# Patient Record
Sex: Female | Born: 1965 | Hispanic: Yes | Smoking: Never smoker
Health system: Southern US, Community
[De-identification: ages and names within clinical notes are randomized; demographics above are authoritative.]

---

## 2004-11-10 ENCOUNTER — Observation Stay: Payer: Self-pay | Admitting: Obstetrics and Gynecology

## 2004-11-11 ENCOUNTER — Inpatient Hospital Stay: Payer: Self-pay | Admitting: Obstetrics and Gynecology

## 2017-07-09 ENCOUNTER — Other Ambulatory Visit: Payer: Self-pay | Admitting: Family Medicine

## 2017-07-09 DIAGNOSIS — Z1239 Encounter for other screening for malignant neoplasm of breast: Secondary | ICD-10-CM

## 2017-08-24 ENCOUNTER — Other Ambulatory Visit: Payer: Self-pay

## 2017-08-24 ENCOUNTER — Encounter: Payer: Self-pay | Admitting: Emergency Medicine

## 2017-08-24 ENCOUNTER — Emergency Department
Admission: EM | Admit: 2017-08-24 | Discharge: 2017-08-25 | Disposition: A | Discharge status: Home / Self Care | Payer: Medicaid Other | Attending: Emergency Medicine | Admitting: Emergency Medicine

## 2017-08-24 DIAGNOSIS — R112 Nausea with vomiting, unspecified: Secondary | ICD-10-CM | POA: Diagnosis not present

## 2017-08-24 DIAGNOSIS — B349 Viral infection, unspecified: Secondary | ICD-10-CM | POA: Insufficient documentation

## 2017-08-24 DIAGNOSIS — R1084 Generalized abdominal pain: Secondary | ICD-10-CM | POA: Diagnosis present

## 2017-08-24 DIAGNOSIS — R109 Unspecified abdominal pain: Secondary | ICD-10-CM

## 2017-08-24 LAB — COMPREHENSIVE METABOLIC PANEL
ALBUMIN: 4.1 g/dL (ref 3.5–5.0)
ALT: 18 U/L (ref 14–54)
ANION GAP: 10 (ref 5–15)
AST: 21 U/L (ref 15–41)
Alkaline Phosphatase: 92 U/L (ref 38–126)
BUN: 13 mg/dL (ref 6–20)
CHLORIDE: 104 mmol/L (ref 101–111)
CO2: 25 mmol/L (ref 22–32)
Calcium: 9.9 mg/dL (ref 8.9–10.3)
Creatinine, Ser: 0.6 mg/dL (ref 0.44–1.00)
GFR calc Af Amer: >60 mL/min (ref >60)
GFR calc non Af Amer: >60 mL/min (ref >60)
GLUCOSE: 111 mg/dL — AB (ref 65–99)
POTASSIUM: 3.8 mmol/L (ref 3.5–5.1)
SODIUM: 139 mmol/L (ref 135–145)
TOTAL PROTEIN: 7.6 g/dL (ref 6.5–8.1)
Total Bilirubin: 0.5 mg/dL (ref 0.3–1.2)

## 2017-08-24 LAB — URINALYSIS, COMPLETE (UACMP) WITH MICROSCOPIC
BILIRUBIN URINE: NEGATIVE
GLUCOSE, UA: NEGATIVE mg/dL
KETONES UR: NEGATIVE mg/dL
LEUKOCYTES UA: NEGATIVE
Nitrite: NEGATIVE
PH: 5 (ref 5.0–8.0)
PROTEIN: NEGATIVE mg/dL
Specific Gravity, Urine: 1.019 (ref 1.005–1.030)

## 2017-08-24 LAB — CBC
HCT: 40 % (ref 35.0–47.0)
HEMOGLOBIN: 13.4 g/dL (ref 12.0–16.0)
MCH: 26.8 pg (ref 26.0–34.0)
MCHC: 33.5 g/dL (ref 32.0–36.0)
MCV: 80 fL (ref 80.0–100.0)
PLATELETS: 264 10*3/uL (ref 150–440)
RBC: 5.01 MIL/uL (ref 3.80–5.20)
RDW: 13.5 % (ref 11.5–14.5)
WBC: 7.9 10*3/uL (ref 3.6–11.0)

## 2017-08-24 LAB — LIPASE, BLOOD: Lipase: 25 U/L (ref 11–51)

## 2017-08-24 NOTE — ED Triage Notes (Addendum)
Patient states she attends [employer], went to class on Wednesday night when it was raining. States next day woke up with body aches and fever ("99.0 degrees"). Developed pain to lower abdomen ("shrinking pain"). Vomited x1 yesterday. Has been taking Advil to combat fever, took last dose one hour ago. States vomiting episode had small amount of blood in it. +Chills. Patient reports neck pain as well (side of neck), full range of motion in neck in triage. Patient reports history of arthritis.

## 2017-08-25 ENCOUNTER — Emergency Department: Payer: Medicaid Other

## 2017-08-25 LAB — TROPONIN I

## 2017-08-25 MED ORDER — ONDANSETRON 4 MG PO TBDP: 4.0000 mg | ORAL_TABLET | Freq: Three times a day (TID) | ORAL | 0 refills | Status: DC | PRN

## 2017-08-25 MED ORDER — GI COCKTAIL ~~LOC~~
30.0000 mL | Freq: Once | ORAL | Status: AC
  Administered 2017-08-25: 30 mL via ORAL
  Filled 2017-08-25: qty 30

## 2017-08-25 MED ORDER — FAMOTIDINE 40 MG PO TABS: 40.0000 mg | ORAL_TABLET | Freq: Every evening | ORAL | 0 refills | Status: AC

## 2017-08-25 NOTE — ED Provider Notes (Signed)
[facility] Emergency Department Provider Note   ____________________________________________   First MD Initiated Contact with Patient 08/24/17 2359     (approximate)  I have reviewed the triage vital signs and the nursing notes.   HISTORY  Chief Complaint Abdominal Pain    HPI Madison Joseph is a 51 y.o. female who comes into the hospital today with some body aches and chills.  She reports that she started having some mid abdominal pain and squeezing as well.  She reports that she has some stress and has not been sleeping well.  She had a temp she states that 98.5 and 98.7 on Thursday and Friday.  She has been taking ibuprofen but reports that the symptoms have continued.  She reports that she has been doing a lot of work for school and has not been resting.  She reports that on Saturday she felt very bad with some nausea and she did vomit she states a couple of times.  The patient thought she needed to eat but it made her feel nauseous.  She states that she has had some lightheadedness as well.  She rates her pain a 4-5 out of 10 in her upper abdomen.  The patient decided to come into the hospital today for evaluation.   History reviewed. No pertinent past medical history.  There are no active problems to display for this patient.   History reviewed. No pertinent surgical history.  Prior to Admission medications   Medication Sig Start Date End Date Taking? Authorizing Provider  famotidine (PEPCID) 40 MG tablet Take 1 tablet (40 mg total) by mouth every evening. 08/25/17 08/25/18  Rebecka Apley, MD  ondansetron (ZOFRAN ODT) 4 MG disintegrating tablet Take 1 tablet (4 mg total) by mouth every 8 (eight) hours as needed for nausea or vomiting. 08/25/17   Rebecka Apley, MD    Allergies Patient has no known allergies.  No family history on file.  Social History Social History   Tobacco Use  . Smoking status: Never Smoker  . Smokeless tobacco:  Never Used  Substance Use Topics  . Alcohol use: No    Frequency: Never  . Drug use: No    Review of Systems  Constitutional: No fever/chills Eyes: No visual changes. ENT: No sore throat. Cardiovascular: Denies chest pain. Respiratory: Denies shortness of breath. Gastrointestinal:  abdominal pain, nausea, vomiting.  No diarrhea.  No constipation. Genitourinary: Negative for dysuria. Musculoskeletal: myalgias Skin: Negative for rash. Neurological: Negative for headaches, focal weakness or numbness.   ____________________________________________   PHYSICAL EXAM:  VITAL SIGNS: ED Triage Vitals  Enc Vitals Group     BP 08/24/17 2035 139/69     Pulse Rate 08/24/17 2035 77     Resp 08/24/17 2035 18     Temp 08/24/17 2035 97.7 F (36.5 C)     Temp Source 08/24/17 2035 Oral     SpO2 08/24/17 2035 98 %     Weight 08/24/17 2035 160 lb (72.6 kg)     Height 08/24/17 2035 5\' 1"  (1.549 m)     Head Circumference --      Peak Flow --      Pain Score 08/24/17 2034 5     Pain Loc --      Pain Edu? --      Excl. in GC? --     Constitutional: Alert and oriented. Well appearing and in mild distress. Eyes: Conjunctivae are normal. PERRL. EOMI. Head: Atraumatic. Nose: No congestion/rhinnorhea.  Mouth/Throat: Mucous membranes are moist.  Oropharynx non-erythematous. Cardiovascular: Normal rate, regular rhythm. Grossly normal heart sounds.  Good peripheral circulation. Respiratory: Normal respiratory effort.  No retractions. Lungs CTAB. Gastrointestinal: Soft with some right upper quadrant tenderness to palpation. No distention. Positive bowel sounds Musculoskeletal: No lower extremity tenderness nor edema.   Neurologic:  Normal speech and language.  Skin:  Skin is warm, dry and intact.  Psychiatric: Mood and affect are normal.   ____________________________________________   LABS (all labs ordered are listed, but only abnormal results are displayed)  Labs Reviewed    COMPREHENSIVE METABOLIC PANEL - Abnormal; Notable for the following components:      Result Value   Glucose, Bld 111 (*)    All other components within normal limits  URINALYSIS, COMPLETE (UACMP) WITH MICROSCOPIC - Abnormal; Notable for the following components:   Color, Urine YELLOW (*)    APPearance HAZY (*)    Hgb urine dipstick SMALL (*)    Bacteria, UA RARE (*)    Squamous Epithelial / LPF 0-5 (*)    All other components within normal limits  LIPASE, BLOOD  CBC  TROPONIN I   ____________________________________________  EKG  none ____________________________________________  RADIOLOGY  [place] Abdomen Limited Ruq  Result Date: 08/25/2017 CLINICAL DATA:  Epigastric pain for 5 days. EXAM: ULTRASOUND ABDOMEN LIMITED RIGHT UPPER QUADRANT COMPARISON:  None. FINDINGS: Gallbladder: No gallstones or wall thickening visualized. No sonographic Murphy sign noted by sonographer. Common bile duct: Diameter: 2.3 mm Liver: No focal lesion identified. Within normal limits in parenchymal echogenicity. Portal vein is patent on color Doppler imaging with normal direction of blood flow towards the liver. Incidental finding of septated partially exophytic cyst of the superior pole of the right kidney measuring 4.6 x 3.4 x 4.1 cm. IMPRESSION: No evidence of acute cholecystitis. Normal appearance of the liver. Complex appearing 4.6 cm right renal cyst. Further evaluation with nonemergent abdominal CT or MRI, renal protocol may be considered. Electronically Signed   By: Ted Mcalpineobrinka  Dimitrova M.D.   On: 08/25/2017 04:39    ____________________________________________   PROCEDURES  Procedure(s) performed: None  Procedures  Critical Care performed: No  ____________________________________________   INITIAL IMPRESSION / ASSESSMENT AND PLAN / ED COURSE  As part of my medical decision making, I reviewed the following data within the electronic MEDICAL RECORD NUMBER Notes from prior ED visits and [place]  Controlled Substance Database   This is a 51 year old female who comes into the hospital today stating that she feels ill.  She has had some body aches and chills as well as some abdominal pain nausea and vomiting.  I did check some blood work on the patient and it was unremarkable.  Her CMP was negative, her lipase was negative and her CBC is negative.  My differential diagnosis did include cholecystitis, biliary colic, reflux, viral upper respiratory infection.  I sent the patient for an ultrasound looking at her gallbladder and it was unremarkable.  At this time I will give the patient a GI cocktail and she will be discharged.  I will encourage the patient to follow-up with her primary care physician for further evaluation of her symptoms.  She likely does have a viral illness.  She will be discharged home.      ____________________________________________   FINAL CLINICAL IMPRESSION(S) / ED DIAGNOSES  Final diagnoses:  Abdominal pain  Viral illness  Non-intractable vomiting with nausea, unspecified vomiting type     ED Discharge Orders  Ordered    ondansetron (ZOFRAN ODT) 4 MG disintegrating tablet  Every 8 hours PRN     08/25/17 0518    famotidine (PEPCID) 40 MG tablet  Every evening     08/25/17 0518       Note:  This document was prepared using Dragon voice recognition software and may include unintentional dictation errors.    Rebecka ApleyWebster, Aser Nylund P, MD 08/25/17 [phone]

## 2017-08-25 NOTE — ED Notes (Signed)
Lab called and spoke with Gwen to add on troponin to blood already in lab.

## 2017-08-25 NOTE — ED Notes (Signed)
Patient transported to Ultrasound 

## 2017-08-25 NOTE — Discharge Instructions (Signed)
Please follow up with your primary care physician.

## 2018-09-07 ENCOUNTER — Encounter: Payer: Medicaid Other | Attending: Family Medicine | Admitting: Dietician

## 2018-09-07 VITALS — Ht 60.0 in | Wt 158.3 lb

## 2018-09-07 DIAGNOSIS — E785 Hyperlipidemia, unspecified: Secondary | ICD-10-CM | POA: Insufficient documentation

## 2018-09-07 DIAGNOSIS — R635 Abnormal weight gain: Secondary | ICD-10-CM | POA: Insufficient documentation

## 2018-09-07 DIAGNOSIS — Z713 Dietary counseling and surveillance: Secondary | ICD-10-CM | POA: Diagnosis not present

## 2018-09-07 NOTE — Progress Notes (Signed)
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  Preferred learning method:  . Visual   Current weight: 158.3lbs with shoes  Height: 5'0" Medications, supplements: reconciled list in medical record  Progress and evaluation: Patient reorts significant weight gain in past couple of years due to decreased physical activity (limited time with full-time school + caring for children); She reports maintaining healthy weight until then.   She reports high stress level with load of responsibilities. She has noticed some increased GI reflux when eating larger portions of bread, and bloating when drinking milk. Cholesterol is elevated, with 06/2018 results of 259 total cholesterol, 178LDL, 184 Triglycerides, 44HDL.  Physical activity: walking, gardening 2-3 hours, 2 times a week  Dietary Intake:  Usual eating pattern includes 3 meals and 2-3 snacks per day. Dining out frequency: 3-4 meals per week.  Breakfast: coffee + fruit banana or apple or orange, pomegranate + toast with fig preserves 1-2x a week or occ cream cheese; or avocado Snack: yogurt Lunch: usually salad with variety of veg. + avocado, sauteed chicken with bell peppers or [place] and provolone or mozarella on sandwich whole wheat or french bread or pita + water or lemonade Snack: granola bar; almond butter crackers Supper: largest meal -- rice sometimes beans/ potatoes + fish 2-3x a week shrimp or chicken + avocado Snack: fruit -- blueberries; occ sweets -- flan maybe 2x a week Beverages: lemonade, water, coffee, tea  Nutrition Care Education: Topics covered: weight control, hyperlipidemia Basic nutrition: basic food groups, appropriate nutrient balance, appropriate meal and snack schedule, general nutrition guidelines     Weight control: benefits of weight control, food and non-food factors that can affect weight, importance of low sugar and low fat choices, appropriate food portions and intake for 1300kcal for weight loss, benefits of tracking intake. Hyperlipidemia: healthy and unhealthy fats, role of fiber, food sources of phytochemicals   Nutritional Diagnosis:  [place]-2.2 Altered nutrition-related laboratory As related to hyperlipidemia.  As evidenced by total cholesterol 259mg /dl, LDL 178mg /dl, and Triglycerides 184mg /dl. [place]-3.4 Unintentional weight gain As related to increased stress, limited activity, excess calories.  As evidenced by patient with current BMI of 30.8, and patient report of eating pattern and lifestyle.  Intervention:   Instruction as noted above.  Patient has been working on healthy eating habits for several years due to hyperlipidemia.  Set goals with direction from patient.   Education Materials given:  . Plate Planner with food lists . Sample meal pattern/ menus . 52 [employer]ucers . Goals/ instructions   Learner/ who was taught:  . Patient   Level of understanding: Marland Kitchen. Verbalizes/ demonstrates competency   Demonstrated degree of understanding via:   Teach back Learning barriers: . None  Willingness to learn/ readiness for change: . Eager, change in progress  Monitoring and Evaluation:  Dietary intake, exercise, blood lipids, and body weight      follow up: 10/19/18

## 2018-09-07 NOTE — Patient Instructions (Signed)
   Try pre-portioning snacks such as nuts to avoid overeating. Keep nuts to 1/4 cup.   Measure food portions of starches such as rice, avocado (ideally 1/2 or less at each meal)  Gradually increase physical activity, even by spending 10-15 minutes walking or other activity, or take stretch or moving breaks while studying.   Keep a journal of food intake and activity to increase awareness and pinpoint areas for improvement.

## 2018-10-19 ENCOUNTER — Ambulatory Visit: Payer: Medicaid Other | Admitting: Dietician

## 2018-10-22 ENCOUNTER — Telehealth: Payer: Self-pay | Admitting: Dietician

## 2018-10-22 NOTE — Telephone Encounter (Signed)
Called patient to reschedule missed appointment from 10/19/18. Left voicemail message requesting a call back.

## 2018-11-05 ENCOUNTER — Encounter: Payer: Self-pay | Admitting: Dietician

## 2019-07-15 IMAGING — US US ABDOMEN LIMITED
1 series · 14 of 25 positions shown · non-contrast
Comparison: None.

CLINICAL DATA: Epigastric pain for 5 days.

EXAM:
ULTRASOUND ABDOMEN LIMITED RIGHT UPPER QUADRANT

[Series 1: us abdomen limited · 0.21mm/px · 14 of 46 slices shown]
[im 1/46]
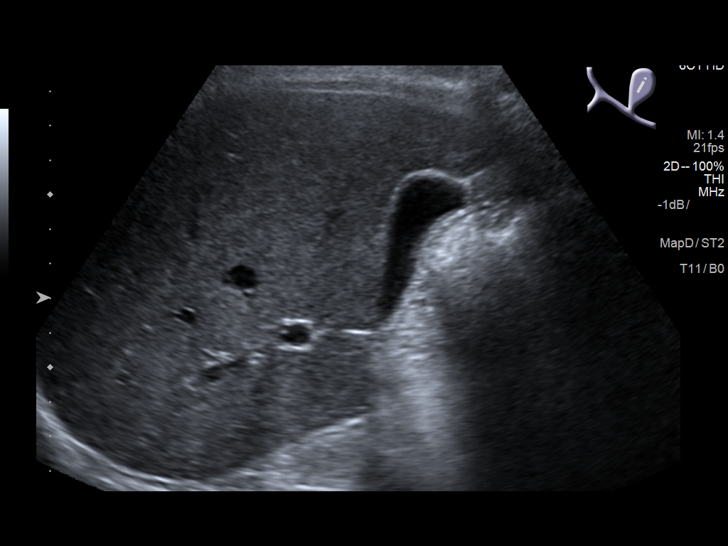
[im 4/46]
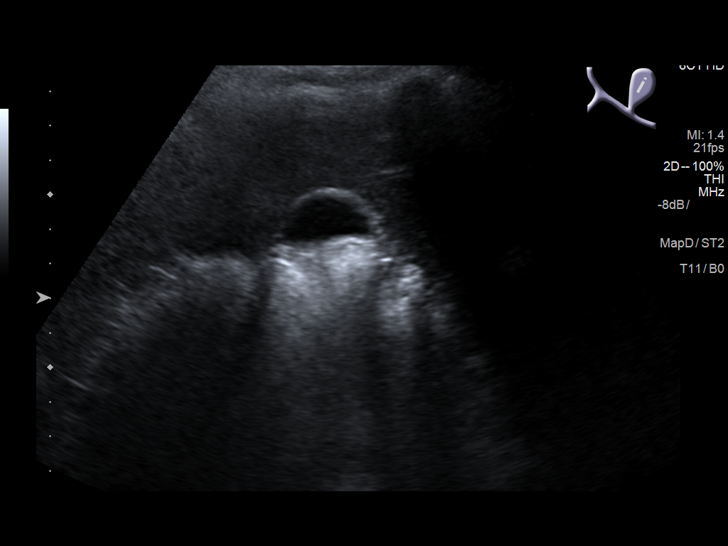
[im 8/46]
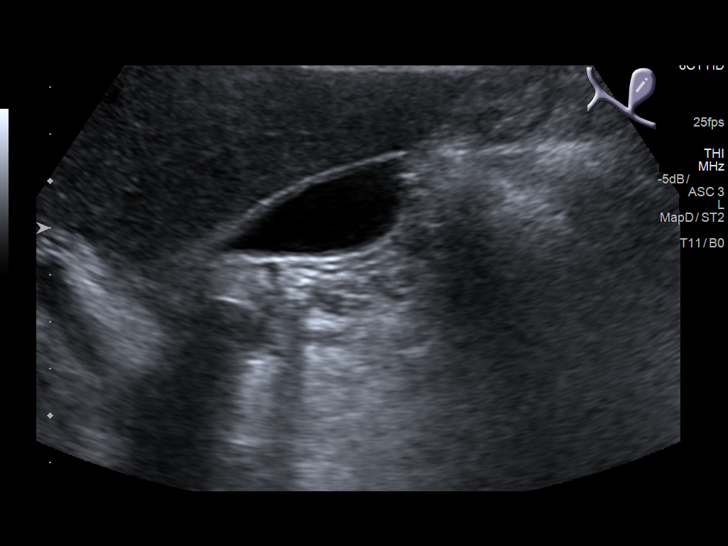
[im 12/46]
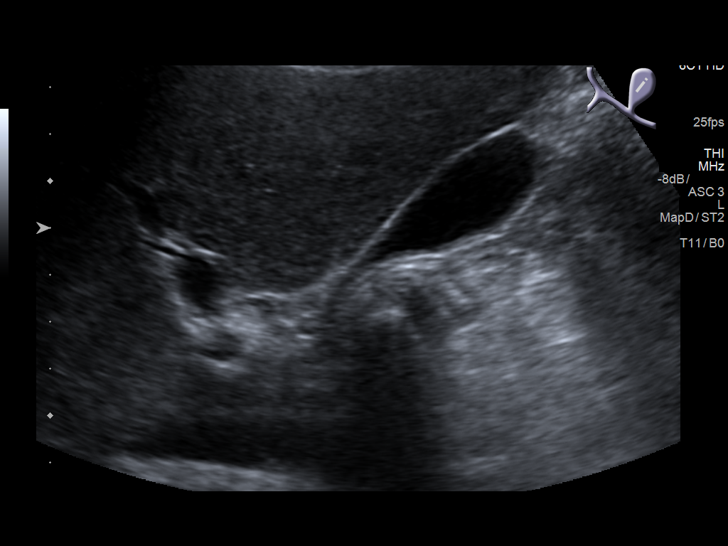
[im 16/46]
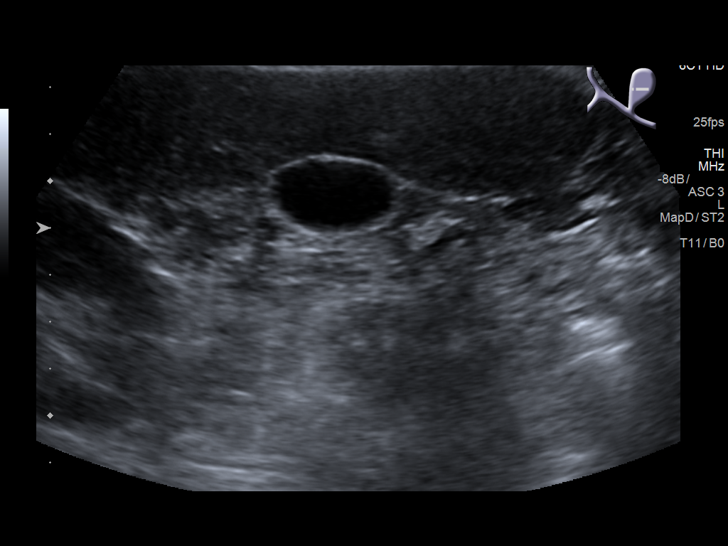
[im 17/46]
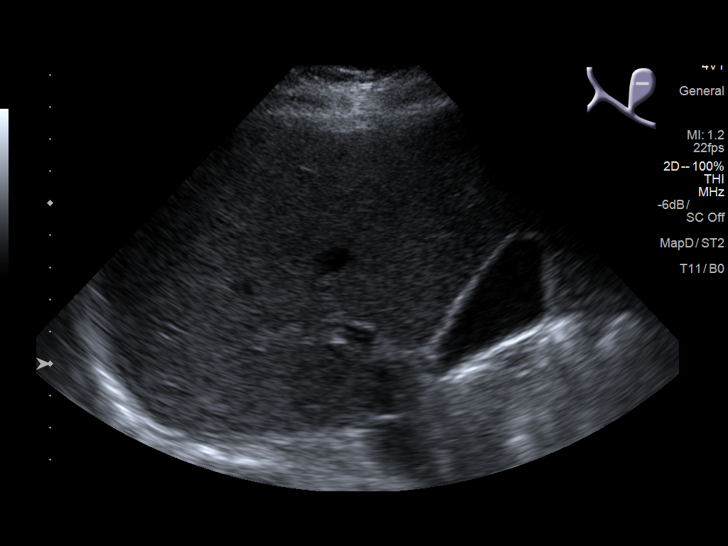
[im 21/46]
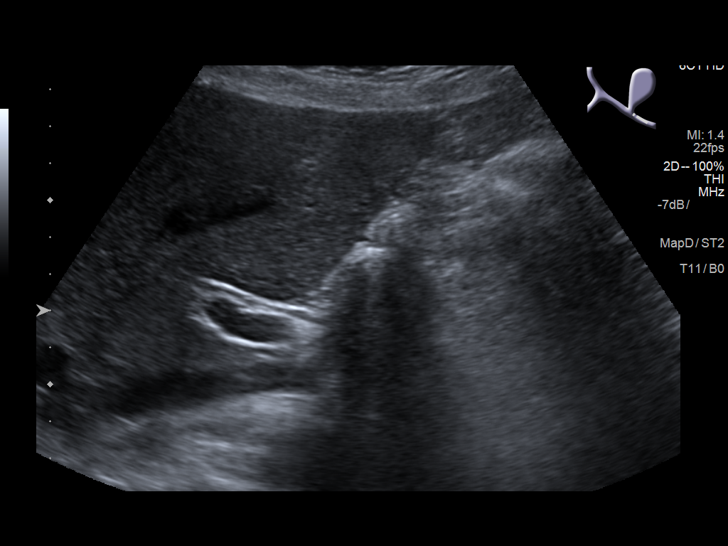
[im 25/46]
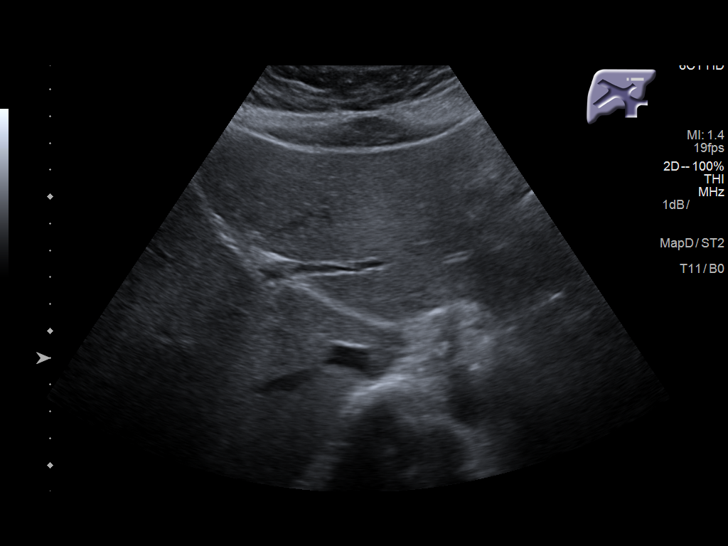
[im 29/46]
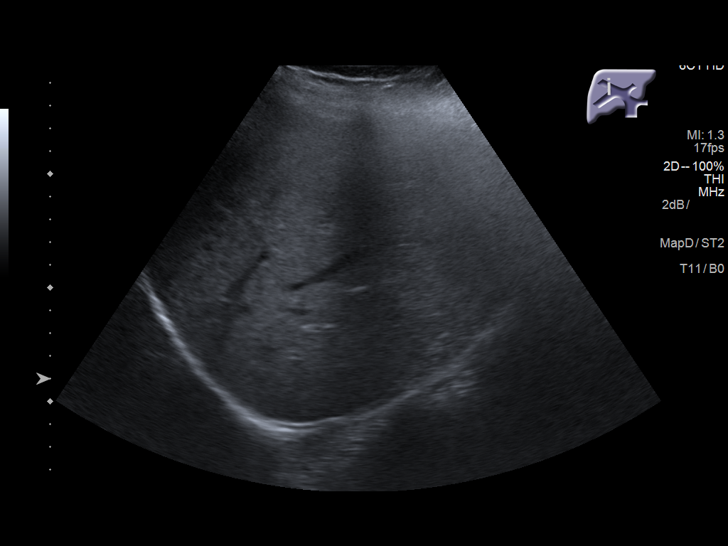
[im 31/46]
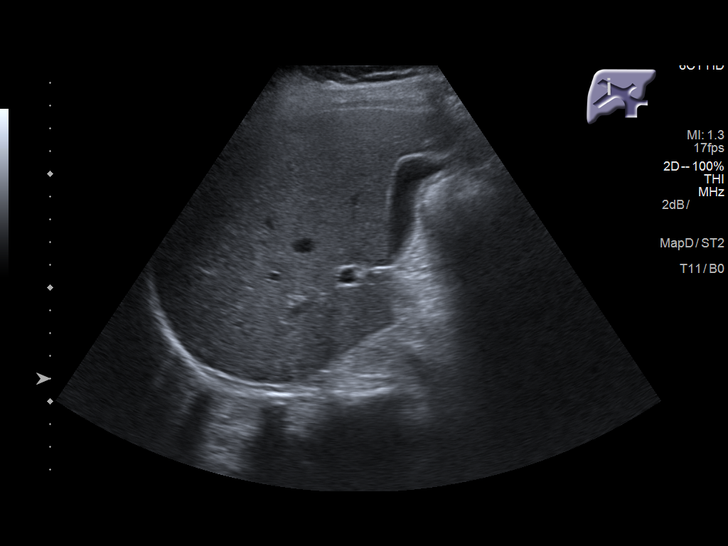
[im 34/46]
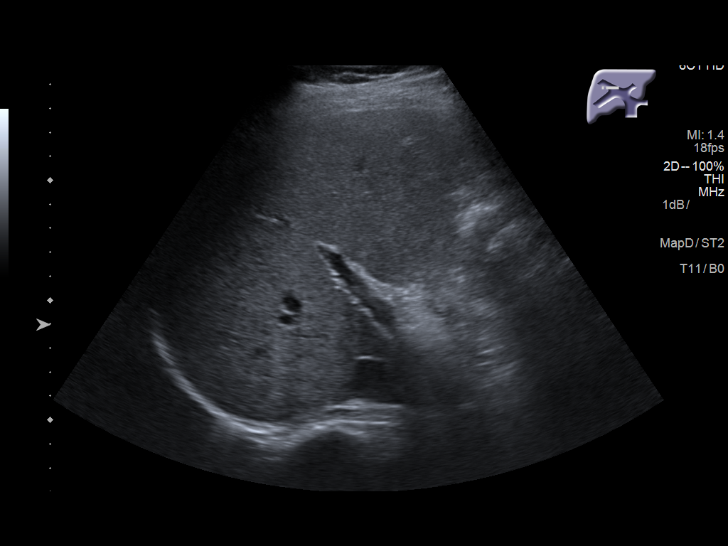
[im 38/46]
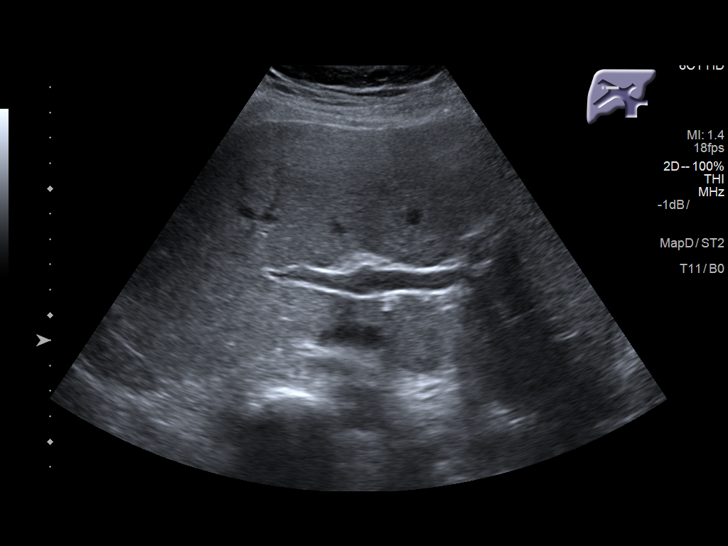
[im 42/46]
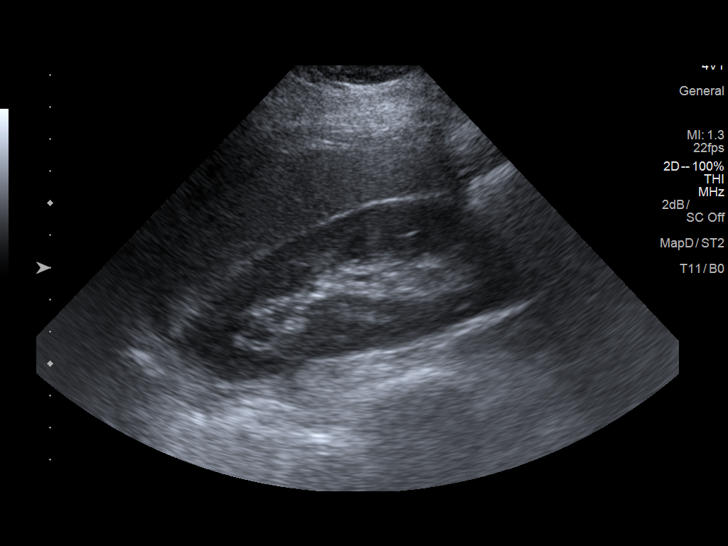
[im 46/46]
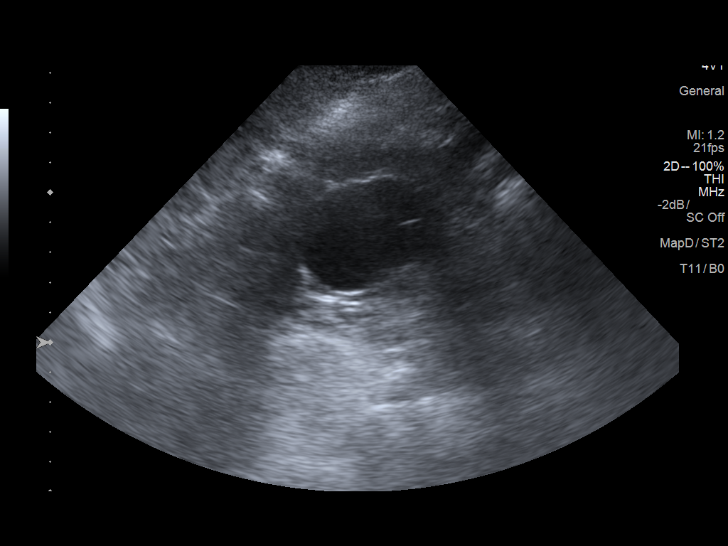

[14 of 25 positions shown; findings below may reference images not displayed]

FINDINGS: Gallbladder:

No gallstones or wall thickening visualized. No sonographic Murphy
sign noted by sonographer.

Common bile duct:

Diameter: 2.3 mm

Liver:

No focal lesion identified. Within normal limits in parenchymal
echogenicity. Portal vein is patent on color Doppler imaging with
normal direction of blood flow towards the liver.

Incidental finding of septated partially exophytic cyst of the
superior pole of the right kidney measuring 4.6 x 3.4 x 4.1 cm.
IMPRESSION: No evidence of acute cholecystitis.

Normal appearance of the liver.

Complex appearing 4.6 cm right renal cyst. Further evaluation with
nonemergent abdominal CT or MRI, renal protocol may be considered.

## 2020-02-18 ENCOUNTER — Ambulatory Visit: Payer: Medicaid Other | Attending: Internal Medicine

## 2020-02-18 ENCOUNTER — Other Ambulatory Visit: Payer: Self-pay

## 2020-02-18 DIAGNOSIS — Z23 Encounter for immunization: Secondary | ICD-10-CM

## 2020-02-18 NOTE — Progress Notes (Signed)
   Covid-19 Vaccination Clinic  Name:  Kymoni Lesperance    MRN: [id number] DOB: 1966/07/31  02/18/2020  Ms. Hoel was observed post Covid-19 immunization for 15 minutes without incident. She was provided with Vaccine Information Sheet and instruction to access the V-Safe system.   Ms. Yuhasz was instructed to call 911 with any severe reactions post vaccine: Marland Kitchen Difficulty breathing  . Swelling of face and throat  . A fast heartbeat  . A bad rash all over body  . Dizziness and weakness   Immunizations Administered    Name Date Dose VIS Date Route   Pfizer COVID-19 Vaccine 02/18/2020 10:40 AM 0.3 mL 11/16/2018 Intramuscular   Manufacturer: [employer], [employer]   Lot: [id number]   NDC: [address]-3

## 2020-03-10 ENCOUNTER — Ambulatory Visit: Payer: Medicaid Other | Attending: Internal Medicine

## 2020-03-10 DIAGNOSIS — Z23 Encounter for immunization: Secondary | ICD-10-CM

## 2020-03-10 NOTE — Progress Notes (Signed)
   Covid-19 Vaccination Clinic  Name:  Madison Joseph    MRN: [id number] DOB: 10/09/1965  03/10/2020  [name] was observed post Covid-19 immunization for 15 minutes without incident. She was provided with Vaccine Information Sheet and instruction to access the V-Safe system.   [name] was instructed to call 911 with any severe reactions post vaccine: Marland Kitchen Difficulty breathing  . Swelling of face and throat  . A fast heartbeat  . A bad rash all over body  . Dizziness and weakness   Immunizations Administered    Name Date Dose VIS Date Route   Pfizer COVID-19 Vaccine 03/10/2020  9:46 AM 0.3 mL 11/16/2018 Intramuscular   Manufacturer: [employer], [employer]   Lot: [id number]   NDC: [address]-8
